# Patient Record
Sex: Female | Born: 1995 | Race: White | Hispanic: No | Marital: Single | Smoking: Never smoker
Health system: Southern US, Community
[De-identification: ages and names within clinical notes are randomized; demographics above are authoritative.]

## PROBLEM LIST (undated history)

## (undated) DIAGNOSIS — G43909 Migraine, unspecified, not intractable, without status migrainosus: Secondary | ICD-10-CM

## (undated) DIAGNOSIS — K589 Irritable bowel syndrome without diarrhea: Secondary | ICD-10-CM

## (undated) DIAGNOSIS — M419 Scoliosis, unspecified: Secondary | ICD-10-CM

## (undated) DIAGNOSIS — F909 Attention-deficit hyperactivity disorder, unspecified type: Secondary | ICD-10-CM

## (undated) HISTORY — DX: Irritable bowel syndrome, unspecified: K58.9

## (undated) HISTORY — DX: Irritable bowel syndrome without diarrhea: K58.9

## (undated) HISTORY — DX: Scoliosis, unspecified: M41.9

## (undated) HISTORY — PX: WISDOM TOOTH EXTRACTION: SHX21

## (undated) HISTORY — DX: Migraine, unspecified, not intractable, without status migrainosus: G43.909

## (undated) HISTORY — DX: Attention-deficit hyperactivity disorder, unspecified type: F90.9

---
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
## 2017-06-23 ENCOUNTER — Encounter: Payer: Self-pay | Admitting: Gastroenterology

## 2017-08-26 ENCOUNTER — Ambulatory Visit: Payer: BLUE CROSS/BLUE SHIELD | Admitting: Gastroenterology

## 2017-08-26 ENCOUNTER — Other Ambulatory Visit (INDEPENDENT_AMBULATORY_CARE_PROVIDER_SITE_OTHER): Payer: BLUE CROSS/BLUE SHIELD

## 2017-08-26 ENCOUNTER — Encounter (INDEPENDENT_AMBULATORY_CARE_PROVIDER_SITE_OTHER): Payer: Self-pay

## 2017-08-26 ENCOUNTER — Encounter: Payer: Self-pay | Admitting: Gastroenterology

## 2017-08-26 VITALS — BP 92/60 | HR 72 | Ht 61.0 in | Wt 97.0 lb

## 2017-08-26 DIAGNOSIS — R1033 Periumbilical pain: Secondary | ICD-10-CM

## 2017-08-26 LAB — IGA: IGA: 81 mg/dL (ref 68–378)

## 2017-08-26 MED ORDER — AMBULATORY NON FORMULARY MEDICATION: 0 refills | Status: AC

## 2017-08-26 MED ORDER — DICYCLOMINE HCL 10 MG PO CAPS: 10.0000 mg | ORAL_CAPSULE | Freq: Three times a day (TID) | ORAL | 3 refills | Status: DC | PRN

## 2017-08-26 NOTE — Patient Instructions (Addendum)
If you are age 21 or older, your body mass index should be between 23-30. Your Body mass index is 18.33 kg/m. If this is out of the aforementioned range listed, please consider follow up with your Primary Care Provider.  If you are age 21 or younger, your body mass index should be between 19-25. Your Body mass index is 18.33 kg/m. If this is out of the aformentioned range listed, please consider follow up with your Primary Care Provider.   We have sent the following medications to your pharmacy for you to pick up at your convenience: Bentyl 10mg   We have given you samples of IBgard today.  You can purchase these over the counter if they improve your symptoms. We have given you a coupon.  Your physician has requested that you go to the basement for the following lab work before leaving today: IGA, TTG  Please call us when you are back in town if your symptoms are not improved.  Thank you.

## 2017-08-26 NOTE — Progress Notes (Signed)
HPI :  21 y/o female history of migraines and scoliosis here for new patient visit referred by [name] for abdominal pain.  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 She reports she has had abdominal pain and a "sensitive stomach" ongoing for years since her childhood. She states symptoms have been stable over this time. She states she has postprandial mid abdominal pain every day, after almost every meal. She thinks having a bowel movement can make her pain feel better. She denies however any constipation or loose stools at baseline, stool is usually normal form. She denies any nausea or vomiting currently, although had this remotely in the past. She has had food allergy testing in the past, states she was positive for allergy to wheat, peanuts, and soy. She does not think she has ever had prior celiac testing. She denies any blood in her stool. Pain is usually in the periumbilical area and does not radiate. She denies any family history of celiac disease or inflammatory bowel disease. Denies any weight loss, reports stable weight over years. She's had lab workup in August as outlined below which was normal. Prior workup in 2009 with an ultrasound and a small bowel follow-through both normal. She has tried probiotics, Lactaid in the past which have not helped. Has not really tried anything else.   H pylori serology negative 05/26/2017 Normal LFTs Hgb 15.2, WBC 3.5, MCV 92, plt 265  US abdomen 08/2008 - normal SBFT 08/2008 - mild reflux, normal otherwise  Past Medical History:  Diagnosis Date  . Migraine   . Scoliosis      Past Surgical History:  Procedure Laterality Date  . WISDOM TOOTH EXTRACTION     Family History  Problem Relation Age of Onset  . Thyroid cancer Sister   . Heart attack Maternal Grandmother        Pacemaker  . Heart disease Maternal Grandfather   . Lung cancer Maternal Grandfather   . Bladder Cancer  Paternal Grandmother    Social History   Tobacco Use  . Smoking status: Never Smoker  . Smokeless tobacco: Never Used  Substance Use Topics  . Alcohol use: Yes    Frequency: Never    Comment: rare  . Drug use: No   Current Outpatient Medications  Medication Sig Dispense Refill  . BIOTIN PO Take by mouth daily.    . Probiotic Product (PROBIOTIC PO) Take daily by mouth.    . rizatriptan (MAXALT-MLT) 10 MG disintegrating tablet Take 10 mg as needed by mouth for migraine. May repeat in 2 hours if needed     No current facility-administered medications for this visit.    No Known Allergies   Review of Systems: All systems reviewed and negative except where noted in HPI.    Labs per HPI   Physical Exam: BP 92/60   Pulse 72   Ht 5\' 1"  (1.549 m)   Wt 97 lb (44 kg)   BMI 18.33 kg/m  Constitutional: Pleasant, thin female in no acute distress. HEENT: Normocephalic and atraumatic. Conjunctivae are normal. No scleral icterus. Neck supple.  Cardiovascular: Normal rate, regular rhythm.  Pulmonary/chest: Effort normal and breath sounds normal. No wheezing, rales or rhonchi. Abdominal: Soft, nondistended, nontender.There are no masses palpable. No hepatomegaly. Extremities: no edema Lymphadenopathy: No cervical adenopathy noted. Neurological: Alert and oriented to person place and time. Skin: Skin is warm and dry. No rashes noted. Psychiatric: Normal mood and affect. Behavior is normal.  ASSESSMENT AND PLAN: 21 year old female with long-standing periumbilical abdominal pain described as above. Stable since childhood. Labs normal. Remote workup with small bowel follow-through and ultrasound were normal.  I suspect she may have functional pain / IBS, however I think labs to assess for celiac disease is reasonable at this time. Unclear if she has true food allergies outlined above versus simply food intolerance, although she should avoid all foods that she was told she may be  allergic to based off prior allergy testing. I offered her a trial of IB gard and gave her samples today. I will also give her a trial of Bentyl to use PRN. She is going back to her school in [place] after Thanksgiving, and will be back in town in December. I asked her to try these medications and see how she does. If no improvement and discomfort persists, I offered her a CT scan given her predominant symptom is pain. I reassured her that given the chronicity of her symptoms with stable symptoms over years, that I think IBD or other concerning process is unlikely however.   She agreed with the plan and will call in a few weeks if no improvement.   Ileene PatrickSteven Armbruster, MD [name] Pager [phone]  CC: Carilyn GoodpastureWillard, Jennifer, PA-C

## 2017-08-27 ENCOUNTER — Other Ambulatory Visit: Payer: Self-pay

## 2017-08-27 ENCOUNTER — Other Ambulatory Visit: Payer: BLUE CROSS/BLUE SHIELD

## 2017-08-27 ENCOUNTER — Telehealth: Payer: Self-pay

## 2017-08-27 DIAGNOSIS — R1033 Periumbilical pain: Secondary | ICD-10-CM

## 2017-08-27 NOTE — Telephone Encounter (Signed)
Thanks [name] 

## 2017-08-27 NOTE — Telephone Encounter (Signed)
Called and spoke to pt's dad. He expressed understanding.  I let him know the lab is open today until 5:00pm and Friday 7:30 to 12:00pm because of the thanksgiving holiday. He indicated she will try to go by today or Friday and have additional draw for test that was missed.

## 2017-08-27 NOTE — Telephone Encounter (Signed)
-----   Message from [name], MD sent at 08/26/2017  5:38 PM EST ----- [name], This patient had an IgA level and a TTG level ordered For some reason the patient only had a IgA level drawn, the TTG was not drawn. Can you ask her to come back to the lab tomorrow for draw. She is heading back to school and leaving town after Thanksgiving. Thanks

## 2017-08-27 NOTE — Progress Notes (Signed)
tisd

## 2017-08-29 LAB — TISSUE TRANSGLUTAMINASE, IGA: (tTG) Ab, IgA: 1 U/mL

## 2018-01-12 ENCOUNTER — Telehealth: Payer: Self-pay | Admitting: Gastroenterology

## 2018-01-12 ENCOUNTER — Other Ambulatory Visit: Payer: Self-pay | Admitting: Gastroenterology

## 2018-01-12 NOTE — Telephone Encounter (Signed)
Pt's mom requesting refill for dicyclomine [facility] [place], [place], phone [phone].

## 2018-02-07 ENCOUNTER — Other Ambulatory Visit: Payer: Self-pay | Admitting: Gastroenterology

## 2018-02-09 NOTE — Telephone Encounter (Signed)
Bentyl 10 mg 1 every 8 hours as needed. Last seen 08-2017. No current follow up scheduled.

## 2018-02-09 NOTE — Telephone Encounter (Signed)
Yes you can refill. thanks 

## 2018-03-09 ENCOUNTER — Other Ambulatory Visit: Payer: Self-pay | Admitting: Gastroenterology

## 2018-04-10 ENCOUNTER — Ambulatory Visit: Payer: BLUE CROSS/BLUE SHIELD | Admitting: Gastroenterology

## 2018-05-27 ENCOUNTER — Other Ambulatory Visit: Payer: Self-pay | Admitting: Gastroenterology

## 2018-06-19 ENCOUNTER — Other Ambulatory Visit: Payer: Self-pay | Admitting: Gastroenterology

## 2018-06-19 MED ORDER — DICYCLOMINE HCL 10 MG PO CAPS: ORAL_CAPSULE | ORAL | 1 refills | Status: DC

## 2018-06-19 NOTE — Telephone Encounter (Signed)
Rx sent 

## 2018-06-23 ENCOUNTER — Encounter

## 2018-06-23 ENCOUNTER — Ambulatory Visit: Payer: Self-pay | Admitting: Gastroenterology

## 2018-07-02 ENCOUNTER — Telehealth: Payer: Self-pay | Admitting: Gastroenterology

## 2018-07-02 ENCOUNTER — Other Ambulatory Visit: Payer: Self-pay

## 2018-07-02 MED ORDER — DICYCLOMINE HCL 10 MG PO CAPS: ORAL_CAPSULE | ORAL | 1 refills | Status: DC

## 2018-07-02 NOTE — Telephone Encounter (Signed)
Patients mother states pt is getting Medication dicyclomine every 10 days and wants to know if she could possibly get it prescribed for a month at a time instead.

## 2018-07-02 NOTE — Telephone Encounter (Signed)
30 day supply with 1 refill sent

## 2018-08-31 ENCOUNTER — Encounter (INDEPENDENT_AMBULATORY_CARE_PROVIDER_SITE_OTHER): Payer: Self-pay

## 2018-08-31 ENCOUNTER — Encounter: Payer: Self-pay | Admitting: Gastroenterology

## 2018-08-31 ENCOUNTER — Ambulatory Visit (INDEPENDENT_AMBULATORY_CARE_PROVIDER_SITE_OTHER): Payer: BLUE CROSS/BLUE SHIELD | Admitting: Gastroenterology

## 2018-08-31 VITALS — BP 100/60 | HR 70 | Ht 61.0 in | Wt 96.0 lb

## 2018-08-31 DIAGNOSIS — K589 Irritable bowel syndrome without diarrhea: Secondary | ICD-10-CM | POA: Diagnosis not present

## 2018-08-31 MED ORDER — DICYCLOMINE HCL 10 MG PO CAPS: ORAL_CAPSULE | ORAL | 3 refills | Status: DC

## 2018-08-31 NOTE — Progress Notes (Signed)
HPI :  22 year old female here for follow-up visit. I previously saw her about a year ago for abdominal pain. See the consultation for full history. She has had long-standing postprandial abdominal discomfort, with some improvement with a bowel movement. She has been tested negative for celiac disease since of last seen her. She's had negative H. Pylori serology with normal blood work historically. Several years ago she had a workup with a negative ultrasound and small bowel follow-through. She has had a peanut allergy and avoids eating peanuts.  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 She is home for Thanksgiving. Since her last visit given cramping with her main complaint we gave her a trial of Bentyl. She reports this is mostly resolved her symptoms entirely. She takes it a few times a day usually after she eats, she reports this reliably relieved her cramps. She didn't eating very well and function better. She denies any nausea or vomiting. She denies any changes in her bowels. Her weight is stable. She is asking for refill the Bentyl.   Past Medical History:  Diagnosis Date  . ADHD   . IBS (irritable bowel syndrome)   . Migraine   . Scoliosis      Past Surgical History:  Procedure Laterality Date  . WISDOM TOOTH EXTRACTION     Family History  Problem Relation Age of Onset  . Thyroid cancer Sister   . Heart attack Maternal Grandmother        Pacemaker  . Heart disease Maternal Grandfather   . Lung cancer Maternal Grandfather   . Bladder Cancer Paternal Grandmother    Social History   Tobacco Use  . Smoking status: Never Smoker  . Smokeless tobacco: Never Used  Substance Use Topics  . Alcohol use: Yes    Frequency: Never    Comment: rare  . Drug use: No   Current Outpatient Medications  Medication Sig Dispense Refill  . AMBULATORY NON FORMULARY MEDICATION Medication Name: IBgard: take as directed on box 12 capsule 0  . dicyclomine (BENTYL) 10 MG capsule  Take 1 to 2 capsules (10 mg) 2 to 3 times a day as needed 270 capsule 3  . rizatriptan (MAXALT-MLT) 10 MG disintegrating tablet Take 10 mg as needed by mouth for migraine. May repeat in 2 hours if needed     No current facility-administered medications for this visit.    Allergies  Allergen Reactions  . Other     PEANUTS-ABDOMINAL PAIN  Gluten sensitive     Review of Systems: All systems reviewed and negative except where noted in HPI.     Physical Exam: BP 100/60   Pulse 70   Ht 5\' 1"  (1.549 m)   Wt 96 lb (43.5 kg)   LMP 08/17/2018 (Approximate)   BMI 18.14 kg/m  Constitutional: Pleasant,well-developed, female in no acute distress. HEENT: Normocephalic and atraumatic. Conjunctivae are normal. No scleral icterus. Neck supple.  Cardiovascular: Normal rate, regular rhythm.  Pulmonary/chest: Effort normal and breath sounds normal. No wheezing, rales or rhonchi. Abdominal: Soft, nondistended, nontender.  There are no masses palpable. No hepatomegaly. Extremities: no edema Lymphadenopathy: No cervical adenopathy noted. Neurological: Alert and oriented to person place and time. Skin: Skin is warm and dry. No rashes noted. Psychiatric: Normal mood and affect. Behavior is normal.   ASSESSMENT AND PLAN: 22 year old female here for reassessment of the following issues:  Irritable bowel syndrome - she's had a very nice response to using Bentyl and that has  for the most part resolved her symptoms and she's functioning much better. She continues to watch her diet and avoiding peanuts. She is previously tested negative for celiac disease. At her last visit we discussed that if she continued to have symptoms we would consider some follow-up imaging. I don't feel that is warranted at this time, her symptoms are chronic with normal labs in the past. She is doing much better at this time with simply using Bentyl and she can continue that as needed. She can follow-up with me as needed moving  forward. All questions answered.  Ileene Patrick, MD [facility]

## 2018-08-31 NOTE — Patient Instructions (Addendum)
If you are age 22 or older, your body mass index should be between 23-30. Your Body mass index is 18.14 kg/m. If this is out of the aforementioned range listed, please consider follow up with your Primary Care Provider.  If you are age 22 or younger, your body mass index should be between 19-25. Your Body mass index is 18.14 kg/m. If this is out of the aformentioned range listed, please consider follow up with your Primary Care Provider.   We have sent the following medications to your pharmacy for you to pick up at your convenience: Bentyl 10mg : take 1 to 2 tablets two to three times a day as needed  Thank you for entrusting me with your care and for choosing  HealthCare, Dr. Ileene PatrickSteven Armbruster

## 2018-10-20 ENCOUNTER — Encounter: Payer: Self-pay | Admitting: Gastroenterology

## 2018-10-20 ENCOUNTER — Telehealth: Payer: Self-pay | Admitting: Gastroenterology

## 2018-10-20 NOTE — Telephone Encounter (Signed)
error 

## 2018-10-20 NOTE — Telephone Encounter (Signed)
Patient's cell phone number will not accept messages; VM is full

## 2018-10-20 NOTE — Telephone Encounter (Signed)
Pt mother called in and stated that daughter needs refill dicyclomine (BENTYL) 10 MG capsule [[id number]]

## 2018-10-20 NOTE — Telephone Encounter (Signed)
PT is returning your call. ° °

## 2018-10-20 NOTE — Telephone Encounter (Signed)
LM for pt to call back to discuss refill request for Bentyl.  We sent 270 capsules with 3 refills 2 months ago on 08-31-2018.

## 2018-10-21 NOTE — Telephone Encounter (Signed)
Called pharmacy [facility] on [address].  They DO see the script we sent in November for 270 capsules with 3 refills.  Pt notified.

## 2019-09-06 ENCOUNTER — Other Ambulatory Visit: Payer: Self-pay

## 2019-09-06 MED ORDER — DICYCLOMINE HCL 10 MG PO CAPS: ORAL_CAPSULE | ORAL | 1 refills | Status: DC

## 2019-12-30 ENCOUNTER — Telehealth: Payer: Self-pay | Admitting: Gastroenterology

## 2019-12-30 MED ORDER — DICYCLOMINE HCL 10 MG PO CAPS: ORAL_CAPSULE | ORAL | 1 refills | Status: DC

## 2019-12-30 NOTE — Telephone Encounter (Signed)
Sent script to [facility] in [place].  Pt needs to keep May appt for further refills.

## 2020-02-15 ENCOUNTER — Encounter: Payer: Self-pay | Admitting: Gastroenterology

## 2020-02-15 ENCOUNTER — Ambulatory Visit (INDEPENDENT_AMBULATORY_CARE_PROVIDER_SITE_OTHER): Payer: BC Managed Care – PPO | Admitting: Gastroenterology

## 2020-02-15 VITALS — BP 110/72 | HR 80 | Temp 98.7°F | Ht 63.0 in | Wt 99.2 lb

## 2020-02-15 DIAGNOSIS — K589 Irritable bowel syndrome without diarrhea: Secondary | ICD-10-CM

## 2020-02-15 MED ORDER — DICYCLOMINE HCL 10 MG PO CAPS: 10.0000 mg | ORAL_CAPSULE | Freq: Three times a day (TID) | ORAL | 3 refills | Status: AC

## 2020-02-15 NOTE — Progress Notes (Signed)
HPI :  24 year old female here for follow-up visit.  She was last seen in November 2019 for suspected IBS.  Please see prior intake consultation for full history.  She is had prior work-up has been negative for celiac disease and normal blood work in the past.  In 2009 she had a negative small bowel follow-through and ultrasound of the right upper quadrant.  She has a known peanut allergy and avoids eating peanuts.  She has graduated from college as of last seen her now lives locally in [place].  She has been taking Bentyl since I've seen her and this works quite well for her.  This has mostly resolved her symptoms, she really does not have any pains when she takes it, has rarel cramps at times which are relieved with a bowel movement.  She takes the Bentyl a few times a day most days the week.  She has roughly 1 bowel movement a day.  No constipation or diarrhea.  No blood in stools.  She is eating well, no weight loss, no nausea or vomiting.  She denies any family history of colon cancer, celiac disease or IBD.  She is asking for refill of medications today, she denies any problems with the regimen and has been quite happy with it.   Past Medical History:  Diagnosis Date  . ADHD   . IBS (irritable bowel syndrome)   . Migraine   . Scoliosis      Past Surgical History:  Procedure Laterality Date  . WISDOM TOOTH EXTRACTION     Family History  Problem Relation Age of Onset  . Thyroid cancer Sister   . Heart attack Maternal Grandmother        Pacemaker  . Heart disease Maternal Grandfather   . Lung cancer Maternal Grandfather   . Bladder Cancer Paternal Grandmother   . Colon cancer Neg Hx   . Esophageal cancer Neg Hx   . Pancreatic cancer Neg Hx   . Stomach cancer Neg Hx    Social History   Tobacco Use  . Smoking status: Never Smoker  . Smokeless tobacco: Never Used  Substance Use Topics  . Alcohol use: Yes    Comment: rare  . Drug use: No   Current Outpatient Medications    Medication Sig Dispense Refill  . AMBULATORY NON FORMULARY MEDICATION Medication Name: IBgard: take as directed on box 12 capsule 0  . dicyclomine (BENTYL) 10 MG capsule Take 1 to 2 capsules (10 mg) 2 to 3 times a day as needed.  Please keep your appointment with Dr. Havery Moros for any further refills. 60 capsule 1  . rizatriptan (MAXALT-MLT) 10 MG disintegrating tablet Take 10 mg as needed by mouth for migraine. May repeat in 2 hours if needed     No current facility-administered medications for this visit.   Allergies  Allergen Reactions  . Other     PEANUTS-ABDOMINAL PAIN  Gluten sensitive     Review of Systems: All systems reviewed and negative except where noted in HPI.    No results found.  Physical Exam: BP 110/72   Pulse 80   Temp 98.7 F (37.1 C)   Ht 5\' 3"  (1.6 m)   Wt 99 lb 4 oz (45 kg)   BMI 17.58 kg/m  Constitutional: Pleasant,well-developed, female in no acute distress. Neurological: Alert and oriented to person place and time. Psychiatric: Normal mood and affect. Behavior is normal.   ASSESSMENT AND PLAN: 24 year old female here for reassessment the following:  Irritable bowel syndrome - doing quite well on Bentyl which has for the most part resolved her symptoms. She is happy with the regimen, denies any problems with tolerability, works very well for her and she is functioning normally.  Prior evaluation with normal labs and celiac testing, and she is responded appropriately to therapy, I do not feel that any further work-up is needed at this time.  She will continue to see me periodically for this issue, I refilled Bentyl for her today.  She can follow-up as needed  Ileene Patrick, MD [facility]
